# Patient Record
Sex: Male | Born: 1980 | Race: Black or African American | Hispanic: No | Marital: Single | Smoking: Never smoker
Health system: Southern US, Community
[De-identification: ages and names within clinical notes are randomized; demographics above are authoritative.]

## PROBLEM LIST (undated history)

## (undated) DIAGNOSIS — E7800 Pure hypercholesterolemia, unspecified: Secondary | ICD-10-CM

## (undated) DIAGNOSIS — I10 Essential (primary) hypertension: Secondary | ICD-10-CM

---

## 2014-05-10 HISTORY — PX: SHOULDER ARTHROSCOPY WITH LABRAL REPAIR: SHX5691

## 2019-11-27 ENCOUNTER — Other Ambulatory Visit: Payer: Self-pay

## 2019-11-27 ENCOUNTER — Ambulatory Visit (INDEPENDENT_AMBULATORY_CARE_PROVIDER_SITE_OTHER): Payer: BC Managed Care – PPO

## 2019-11-27 ENCOUNTER — Other Ambulatory Visit: Payer: Self-pay | Admitting: Nurse Practitioner

## 2019-11-27 ENCOUNTER — Telehealth: Payer: Self-pay | Admitting: Nurse Practitioner

## 2019-11-27 ENCOUNTER — Ambulatory Visit
Admission: EM | Admit: 2019-11-27 | Discharge: 2019-11-27 | Disposition: A | Discharge status: Home / Self Care | Payer: BC Managed Care – PPO | Attending: Emergency Medicine | Admitting: Emergency Medicine

## 2019-11-27 DIAGNOSIS — U071 COVID-19: Secondary | ICD-10-CM

## 2019-11-27 DIAGNOSIS — Z6841 Body Mass Index (BMI) 40.0 and over, adult: Secondary | ICD-10-CM

## 2019-11-27 HISTORY — DX: Essential (primary) hypertension: I10

## 2019-11-27 HISTORY — DX: Morbid (severe) obesity due to excess calories: E66.01

## 2019-11-27 HISTORY — DX: Pure hypercholesterolemia, unspecified: E78.00

## 2019-11-27 LAB — SARS CORONAVIRUS 2 AG (30 MIN TAT): SARS Coronavirus 2 Ag: POSITIVE — AB

## 2019-11-27 MED ORDER — BENZONATATE 200 MG PO CAPS
200.0000 mg | ORAL_CAPSULE | Freq: Three times a day (TID) | ORAL | 0 refills | Status: AC | PRN
Start: 2019-11-27 — End: ?

## 2019-11-27 MED ORDER — ACETAMINOPHEN 500 MG PO TABS
1000.0000 mg | ORAL_TABLET | Freq: Once | ORAL | Status: AC
  Administered 2019-11-27: 1000 mg via ORAL

## 2019-11-27 MED ORDER — IBUPROFEN 600 MG PO TABS
600.0000 mg | ORAL_TABLET | Freq: Once | ORAL | Status: AC
  Administered 2019-11-27: 600 mg via ORAL

## 2019-11-27 MED ORDER — IBUPROFEN 600 MG PO TABS: 600.0000 mg | ORAL_TABLET | Freq: Four times a day (QID) | ORAL | 0 refills | Status: AC | PRN

## 2019-11-27 NOTE — Discharge Instructions (Addendum)
I have called the infusion clinic.  Somebody should be reaching out to you tomorrow to schedule something.  I have let them know about your situation.  Take 600 mg of ibuprofen combined with 1000 mg of Tylenol together 3-4 times a day as needed for pain.  Tessalon for cough.  Push plenty of electrolyte containing fluids such as Pedialyte or Gatorade.  Buy a finger pulse oximeter to monitor your oxygen saturation.  Go immediately to the ER for worsening shortness of breath, oxygen saturation below 90, fever not relieved with Tylenol/ibuprofen, severe headache, or for any other concerns.

## 2019-11-27 NOTE — ED Triage Notes (Signed)
Patient presents with SOB, cough, fatigue, weakness, fever, and chills. Patient states his sister tested positive for COVID-19 on [place]. Patient's sx onset was last evening. Patient denies pain or respiratory distress at this time.

## 2019-11-27 NOTE — Progress Notes (Signed)
I connected by phone with Derek Walters. on 11/27/2019 at 9:27 PM to discuss the potential use of a new treatment for mild to moderate COVID-19 viral infection in non-hospitalized patients.  This patient is a 39 y.o. male that meets the FDA criteria for Emergency Use Authorization of COVID monoclonal antibody casirivimab/imdevimab.  Has a (+) direct SARS-CoV-2 viral test result  Has mild or moderate COVID-19   Is NOT hospitalized due to COVID-19  Is within 10 days of symptom onset  Has at least one of the high risk factor(s) for progression to severe COVID-19 and/or hospitalization as defined in EUA.  Specific high risk criteria : BMI > 25   I have spoken and communicated the following to the patient or parent/caregiver regarding COVID monoclonal antibody treatment:  1. FDA has authorized the emergency use for the treatment of mild to moderate COVID-19 in adults and pediatric patients with positive results of direct SARS-CoV-2 viral testing who are 64 years of age and older weighing at least 40 kg, and who are at high risk for progressing to severe COVID-19 and/or hospitalization.  2. The significant known and potential risks and benefits of COVID monoclonal antibody, and the extent to which such potential risks and benefits are unknown.  3. Information on available alternative treatments and the risks and benefits of those alternatives, including clinical trials.  4. Patients treated with COVID monoclonal antibody should continue to self-isolate and use infection control measures (e.g., wear mask, isolate, social distance, avoid sharing personal items, clean and disinfect high touch surfaces, and frequent handwashing) according to CDC guidelines.   5. The patient or parent/caregiver has the option to accept or refuse COVID monoclonal antibody treatment.  After reviewing this information with the patient, The patient agreed to proceed with receiving casirivimab\imdevimab infusion and  will be provided a copy of the Fact sheet prior to receiving the infusion. [name] 11/27/2019 9:27 PM

## 2019-11-27 NOTE — Telephone Encounter (Signed)
Called to discuss with Derek Walters. about Covid symptoms and the use of casirivimab/imdevimab, a combination monoclonal antibody infusion for those with mild to moderate Covid symptoms and at a high risk of hospitalization.     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 Symptoms of fever, cough, body aches, shortness of breath started on 11/24/19.   Patient verbalized understanding of infusion details and has requested an appointment. He is scheduled for 11/29/19 @ 1230.   Willette Alma, AGPCNP-BC Pager: [phone] Amion: Thea Alken

## 2019-11-27 NOTE — ED Provider Notes (Signed)
HPI  SUBJECTIVE:  Derek Stead. is a 39 y.o. male who presents with body aches, headaches, fevers T-max 101, cough, shortness of breath with exertion starting last night.  He was exposed to his sister, who is Covid +3 days ago.  No nasal congestion, sore throat, loss of sense of smell or taste, wheezing, chest pain, nausea, vomiting, diarrhea, abdominal pain.  He has not yet gotten Covid vaccine.  He tried pushing fluids and took 2 Aleve this morning without improvement of symptoms.  There are no aggravating or alleviating factors.  Past medical history of hypertension, hypercholesterolemia, obesity.  No history of pulmonary disease, smoking, diabetes, chronic kidney disease, coronary artery disease, HIV, cancer, immunocompromise.  He is a Administrator, lives in [place].  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    Past Medical History:  Diagnosis Date  . Hypercholesterolemia   . Hypertension   . Morbid obesity ([place])     Past Surgical History:  Procedure Laterality Date  . SHOULDER ARTHROSCOPY WITH LABRAL REPAIR Right 2016    Family History  Problem Relation Age of Onset  . Healthy Mother   . Aneurysm Father   . Hypertension Father     Social History   Tobacco Use  . Smoking status: Never Smoker  . Smokeless tobacco: Never Used  Vaping Use  . Vaping Use: Former  Substance Use Topics  . Alcohol use: Yes    Comment: occasionally  . Drug use: Never    No current facility-administered medications for this encounter.  Current Outpatient Medications:  .  amLODipine (NORVASC) 10 MG tablet, Take 10 mg by mouth daily., Disp: , Rfl:  .  hydrochlorothiazide (HYDRODIURIL) 25 MG tablet, Take 25 mg by mouth daily., Disp: , Rfl:  .  losartan (COZAAR) 100 MG tablet, Take 100 mg by mouth daily., Disp: , Rfl:  .  pravastatin (PRAVACHOL) 20 MG tablet, Take 20 mg by mouth at bedtime., Disp: , Rfl:  .  SAXENDA 18 MG/3ML SOPN, Inject 3 mg into the skin  daily., Disp: , Rfl:  .  benzonatate (TESSALON) 200 MG capsule, Take 1 capsule (200 mg total) by mouth 3 (three) times daily as needed for cough., Disp: 30 capsule, Rfl: 0 .  ibuprofen (ADVIL) 600 MG tablet, Take 1 tablet (600 mg total) by mouth every 6 (six) hours as needed., Disp: 30 tablet, Rfl: 0  No Known Allergies   ROS  As noted in HPI.   Physical Exam  BP 135/73 (BP Location: Right Arm)   Pulse (!) 125   Temp (!) 101.3 F (38.5 C) (Oral)   Resp 20   Ht 6' (1.829 m)   Wt (!) 206.8 kg   SpO2 96%   BMI 61.84 kg/m   Constitutional: Well developed, well nourished, no acute respiratory distress.  Appears ill. Eyes:  EOMI, conjunctiva normal bilaterally HENT: Normocephalic, atraumatic,mucus membranes moist Respiratory: Normal inspiratory effort lungs clear bilaterally Cardiovascular: Regular tachycardia, no murmurs rubs or gallops GI: nondistended skin: No rash, skin intact Musculoskeletal: no deformities Neurologic: Alert & oriented x 3, no focal neuro deficits Psychiatric: Speech and behavior appropriate   ED Course   Medications  acetaminophen (TYLENOL) tablet 1,000 mg (1,000 mg Oral Given 11/27/19 2002)  ibuprofen (ADVIL) tablet 600 mg (600 mg Oral Given 11/27/19 2003)    Orders Placed This Encounter  Procedures  . SARS Coronavirus 2 Ag (30 min TAT) - Nasal Swab (BD Veritor Kit)  Standing Status:   Standing    Number of Occurrences:   1    Order Specific Question:   Is this test for diagnosis or screening    Answer:   Diagnosis of ill patient    Order Specific Question:   Symptomatic for COVID-19 as defined by CDC    Answer:   Yes    Order Specific Question:   Date of Symptom Onset    Answer:   11/26/2019    Order Specific Question:   Hospitalized for COVID-19    Answer:   No    Order Specific Question:   Admitted to ICU for COVID-19    Answer:   No    Order Specific Question:   Previously tested for COVID-19    Answer:   No    Order Specific Question:    Resident in a congregate (group) care setting    Answer:   No    Order Specific Question:   Employed in healthcare setting    Answer:   No    Order Specific Question:   Has patient completed COVID vaccination(s) (2 doses of Pfizer/Moderna 1 dose of [employer])    Answer:   No  . DG Chest 2 View    Standing Status:   Standing    Number of Occurrences:   1    Order Specific Question:   Reason for Exam (SYMPTOM  OR DIAGNOSIS REQUIRED)    Answer:   close covid exposure, now with fevers, cough.  Will pneumonia.  . Airborne and Contact precautions    Standing Status:   Standing    Number of Occurrences:   1    Results for orders placed or performed during the hospital encounter of 11/27/19 (from the past 24 hour(s))  SARS Coronavirus 2 Ag (30 min TAT) - Nasal Swab (BD Veritor Kit)     Status: Abnormal   Collection Time: 11/27/19  7:20 PM   Specimen: Nasal Swab (BD Veritor Kit)  Result Value Ref Range   SARS Coronavirus 2 Ag POSITIVE (A) NEGATIVE   DG Chest 2 View  Result Date: 11/27/2019 CLINICAL DATA:  39 year old male with exposure to [place] presenting with fever and cough. EXAM: CHEST - 2 VIEW COMPARISON:  None. FINDINGS: Minimal posterior subpleural density, on the lateral view, likely artifactual. Developing infiltrate is less likely. Clinical correlation is recommended. No focal consolidation, pleural effusion, pneumothorax. Top-normal cardiac size. No acute osseous pathology. IMPRESSION: No focal consolidation. Electronically Signed   By: Anner Crete M.D.   On: 11/27/2019 19:36    ED Clinical Impression  1. COVID-19 virus infection      ED Assessment/Plan  Rapid Covid positive.  Covid infusion clinic called, left message and explained patient social situation.  Giving 1000 mg of Tylenol with a 600 mg of ibuprofen here.  Reviewed imaging independently.  No consolidation.  See radiology report for full details.  Patient states that he can stay here in his truck  until he is contacted by the Covid infusion clinic.  We will send him home with Tylenol/ibuprofen, Tessalon, advised patient to buy a pulse oximeter and monitor his pulse ox.  He is to go to the hospital for worsening symptoms or O2 sats below 90.    Discussed labs, imaging, MDM, treatment plan, and plan for follow-up with patient. Discussed sn/sx that should prompt return to the ED. patient agrees with plan.   Meds ordered this encounter  Medications  . acetaminophen (TYLENOL) tablet  1,000 mg  . ibuprofen (ADVIL) tablet 600 mg  . ibuprofen (ADVIL) 600 MG tablet    Sig: Take 1 tablet (600 mg total) by mouth every 6 (six) hours as needed.    Dispense:  30 tablet    Refill:  0  . benzonatate (TESSALON) 200 MG capsule    Sig: Take 1 capsule (200 mg total) by mouth 3 (three) times daily as needed for cough.    Dispense:  30 capsule    Refill:  0    *This clinic note was created using [employer]. Therefore, there may be occasional mistakes despite careful proofreading.   ?    Melynda Ripple, MD 11/27/19 2016

## 2019-11-29 ENCOUNTER — Ambulatory Visit (HOSPITAL_COMMUNITY): Payer: BC Managed Care – PPO

## 2019-12-09 DEATH — deceased

## 2021-05-30 IMAGING — CR DG CHEST 2V
2 series · 2 of 2 positions shown · non-contrast
Comparison: None.

CLINICAL DATA: 38-year-old male with exposure to COVID presenting
with fever and cough.

EXAM:
CHEST - 2 VIEW

[chest pa]
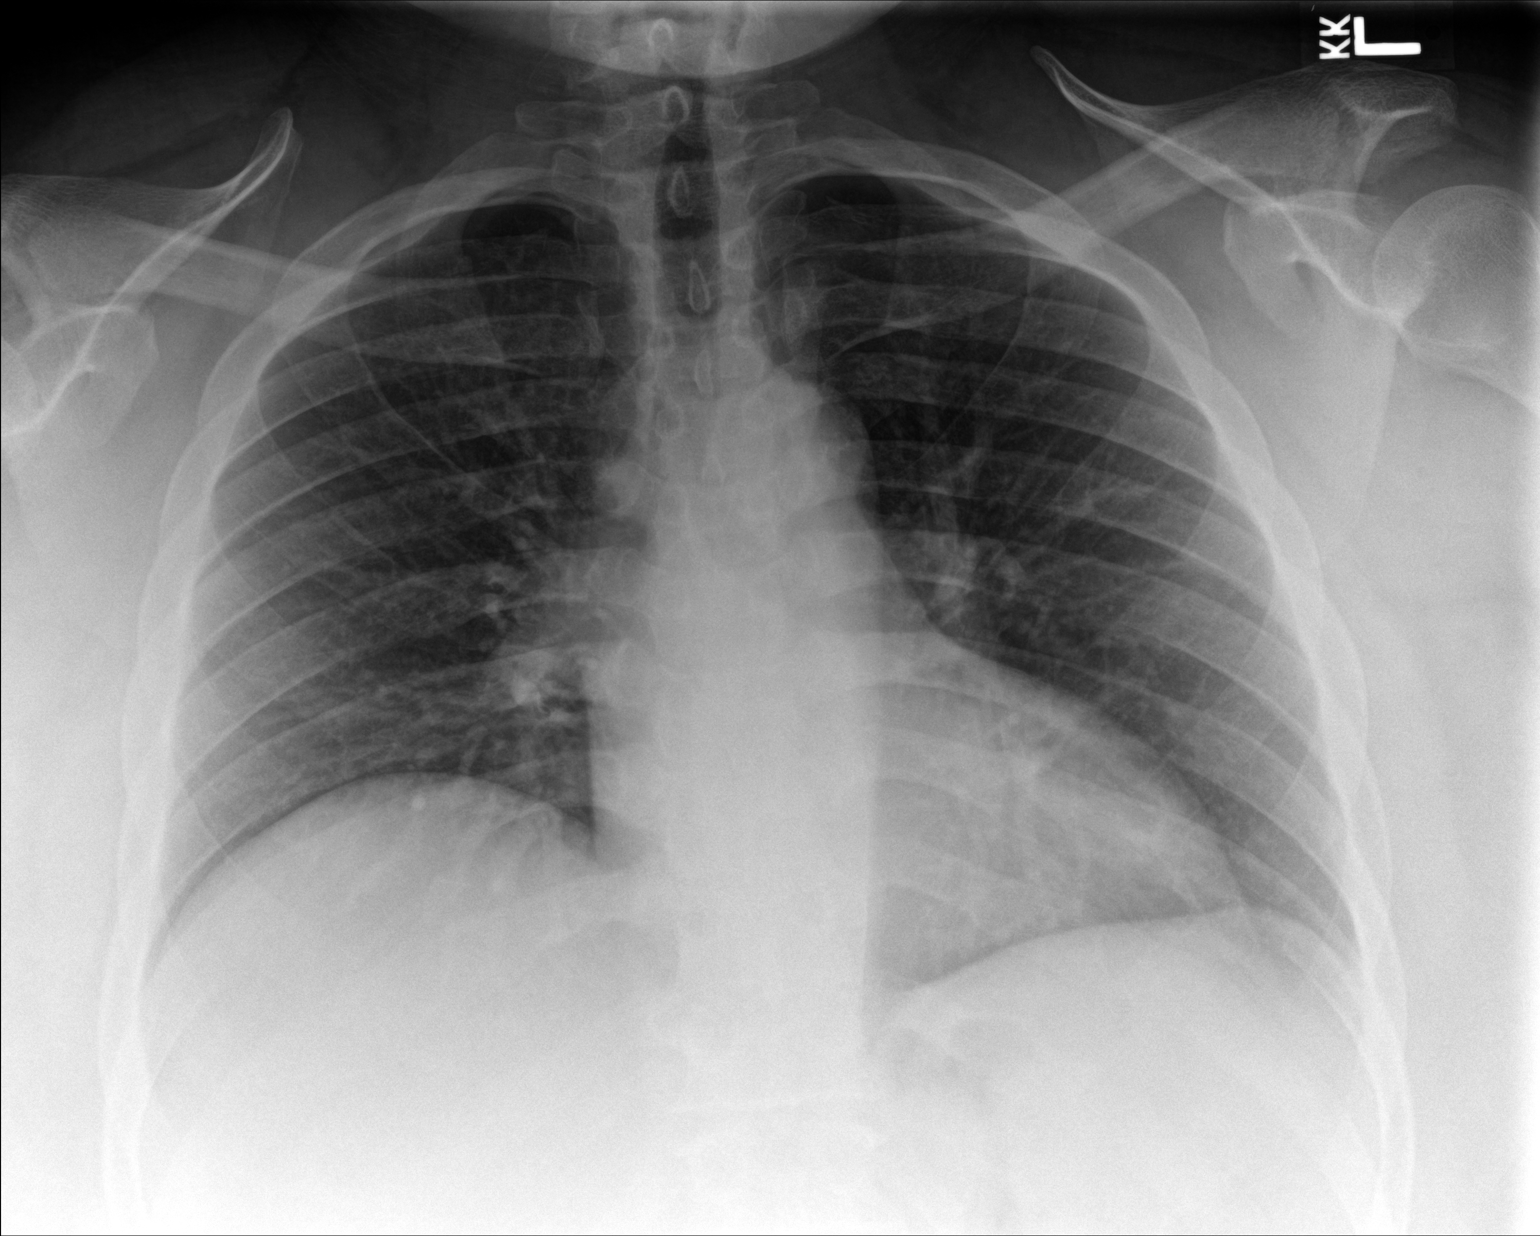

[chest lat]
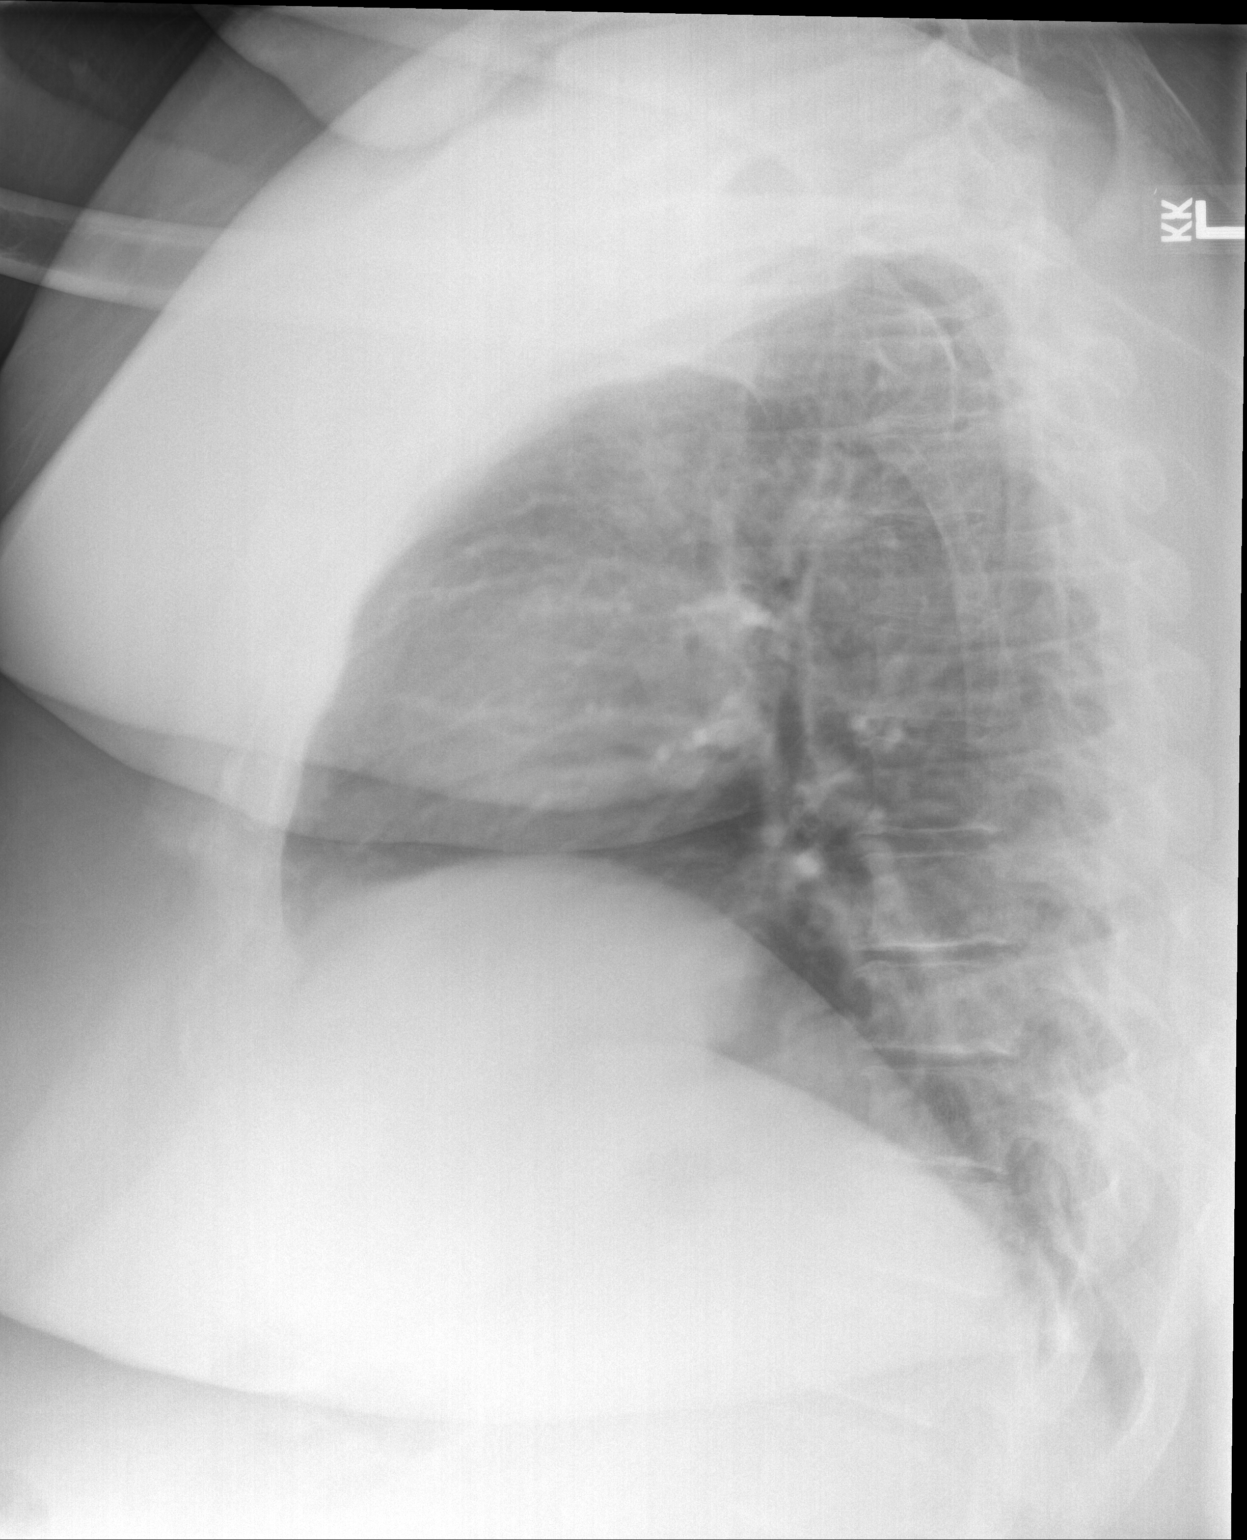

[2 of 2 positions shown; findings below may reference images not displayed]

FINDINGS: Minimal posterior subpleural density, on the lateral view, likely
artifactual. Developing infiltrate is less likely. Clinical
correlation is recommended. No focal consolidation, pleural
effusion, pneumothorax. Top-normal cardiac size. No acute osseous
pathology.
IMPRESSION: No focal consolidation.
